# Patient Record
Sex: Female | Born: 1945 | Race: White | Hispanic: No | Smoking: Never smoker
Health system: Southern US, Community
[De-identification: ages and names within clinical notes are randomized; demographics above are authoritative.]

## PROBLEM LIST (undated history)

## (undated) DIAGNOSIS — E119 Type 2 diabetes mellitus without complications: Secondary | ICD-10-CM

## (undated) DIAGNOSIS — I10 Essential (primary) hypertension: Secondary | ICD-10-CM

## (undated) HISTORY — PX: OTHER SURGICAL HISTORY: SHX169

## (undated) HISTORY — PX: KNEE SURGERY: SHX244

## (undated) HISTORY — PX: CORONARY STENT PLACEMENT: SHX1402

## (undated) HISTORY — PX: CHOLECYSTECTOMY: SHX55

---

## 2016-02-24 ENCOUNTER — Encounter (HOSPITAL_COMMUNITY): Payer: Self-pay | Admitting: *Deleted

## 2016-02-24 ENCOUNTER — Emergency Department (HOSPITAL_COMMUNITY): Payer: Medicare Other

## 2016-02-24 ENCOUNTER — Observation Stay (HOSPITAL_COMMUNITY)
Admission: EM | Admit: 2016-02-24 | Discharge: 2016-02-25 | Disposition: A | Discharge status: Home / Self Care | Payer: Medicare Other | Attending: Family Medicine | Admitting: Family Medicine

## 2016-02-24 DIAGNOSIS — N390 Urinary tract infection, site not specified: Secondary | ICD-10-CM | POA: Insufficient documentation

## 2016-02-24 DIAGNOSIS — N179 Acute kidney failure, unspecified: Secondary | ICD-10-CM | POA: Diagnosis not present

## 2016-02-24 DIAGNOSIS — E785 Hyperlipidemia, unspecified: Secondary | ICD-10-CM | POA: Insufficient documentation

## 2016-02-24 DIAGNOSIS — R262 Difficulty in walking, not elsewhere classified: Secondary | ICD-10-CM | POA: Diagnosis not present

## 2016-02-24 DIAGNOSIS — E119 Type 2 diabetes mellitus without complications: Secondary | ICD-10-CM | POA: Insufficient documentation

## 2016-02-24 DIAGNOSIS — D649 Anemia, unspecified: Secondary | ICD-10-CM | POA: Insufficient documentation

## 2016-02-24 DIAGNOSIS — R4189 Other symptoms and signs involving cognitive functions and awareness: Secondary | ICD-10-CM

## 2016-02-24 DIAGNOSIS — Z955 Presence of coronary angioplasty implant and graft: Secondary | ICD-10-CM | POA: Diagnosis not present

## 2016-02-24 DIAGNOSIS — R55 Syncope and collapse: Principal | ICD-10-CM | POA: Insufficient documentation

## 2016-02-24 DIAGNOSIS — R8271 Bacteriuria: Secondary | ICD-10-CM

## 2016-02-24 DIAGNOSIS — N183 Chronic kidney disease, stage 3 unspecified: Secondary | ICD-10-CM

## 2016-02-24 DIAGNOSIS — Z66 Do not resuscitate: Secondary | ICD-10-CM | POA: Diagnosis not present

## 2016-02-24 DIAGNOSIS — I4891 Unspecified atrial fibrillation: Secondary | ICD-10-CM | POA: Insufficient documentation

## 2016-02-24 DIAGNOSIS — Z79899 Other long term (current) drug therapy: Secondary | ICD-10-CM | POA: Diagnosis not present

## 2016-02-24 DIAGNOSIS — I2510 Atherosclerotic heart disease of native coronary artery without angina pectoris: Secondary | ICD-10-CM | POA: Insufficient documentation

## 2016-02-24 DIAGNOSIS — I10 Essential (primary) hypertension: Secondary | ICD-10-CM | POA: Insufficient documentation

## 2016-02-24 DIAGNOSIS — Z794 Long term (current) use of insulin: Secondary | ICD-10-CM | POA: Insufficient documentation

## 2016-02-24 HISTORY — DX: Type 2 diabetes mellitus without complications: E11.9

## 2016-02-24 HISTORY — DX: Essential (primary) hypertension: I10

## 2016-02-24 LAB — CBG MONITORING, ED: Glucose-Capillary: 190 mg/dL — ABNORMAL HIGH (ref 65–99)

## 2016-02-24 LAB — BASIC METABOLIC PANEL
ANION GAP: 8 (ref 5–15)
BUN: 16 mg/dL (ref 6–20)
CO2: 22 mmol/L (ref 22–32)
Calcium: 8.7 mg/dL — ABNORMAL LOW (ref 8.9–10.3)
Chloride: 110 mmol/L (ref 101–111)
Creatinine, Ser: 1.91 mg/dL — ABNORMAL HIGH (ref 0.44–1.00)
GFR calc Af Amer: 29 mL/min — ABNORMAL LOW (ref >60)
GFR, EST NON AFRICAN AMERICAN: 25 mL/min — AB (ref >60)
GLUCOSE: 191 mg/dL — AB (ref 65–99)
POTASSIUM: 4 mmol/L (ref 3.5–5.1)
SODIUM: 140 mmol/L (ref 135–145)

## 2016-02-24 LAB — CBC
HEMATOCRIT: 28.9 % — AB (ref 36.0–46.0)
HEMATOCRIT: 28.3 % — AB (ref 36.0–46.0)
HEMOGLOBIN: 9.6 g/dL — AB (ref 12.0–15.0)
Hemoglobin: 9.1 g/dL — ABNORMAL LOW (ref 12.0–15.0)
MCH: 30.4 pg (ref 26.0–34.0)
MCH: 29.6 pg (ref 26.0–34.0)
MCHC: 33.2 g/dL (ref 30.0–36.0)
MCHC: 32.2 g/dL (ref 30.0–36.0)
MCV: 91.5 fL (ref 78.0–100.0)
MCV: 92.2 fL (ref 78.0–100.0)
Platelets: 284 10*3/uL (ref 150–400)
Platelets: 316 10*3/uL (ref 150–400)
RBC: 3.16 MIL/uL — AB (ref 3.87–5.11)
RBC: 3.07 MIL/uL — ABNORMAL LOW (ref 3.87–5.11)
RDW: 13.5 % (ref 11.5–15.5)
RDW: 13.7 % (ref 11.5–15.5)
WBC: 8.3 10*3/uL (ref 4.0–10.5)
WBC: 9.6 10*3/uL (ref 4.0–10.5)

## 2016-02-24 LAB — URINALYSIS, ROUTINE W REFLEX MICROSCOPIC
Bilirubin Urine: NEGATIVE
Glucose, UA: NEGATIVE mg/dL
Hgb urine dipstick: NEGATIVE
Ketones, ur: NEGATIVE mg/dL
NITRITE: NEGATIVE
PH: 5.5 (ref 5.0–8.0)
Protein, ur: 30 mg/dL — AB
SPECIFIC GRAVITY, URINE: 1.018 (ref 1.005–1.030)

## 2016-02-24 LAB — PROTIME-INR
INR: 1.67 — AB (ref 0.00–1.49)
Prothrombin Time: 19.7 seconds — ABNORMAL HIGH (ref 11.6–15.2)

## 2016-02-24 LAB — I-STAT CHEM 8, ED
BUN: 20 mg/dL (ref 6–20)
Calcium, Ion: 1.06 mmol/L — ABNORMAL LOW (ref 1.12–1.23)
Chloride: 109 mmol/L (ref 101–111)
Creatinine, Ser: 1.9 mg/dL — ABNORMAL HIGH (ref 0.44–1.00)
Glucose, Bld: 192 mg/dL — ABNORMAL HIGH (ref 65–99)
HEMATOCRIT: 29 % — AB (ref 36.0–46.0)
HEMOGLOBIN: 9.9 g/dL — AB (ref 12.0–15.0)
POTASSIUM: 5.1 mmol/L (ref 3.5–5.1)
Sodium: 141 mmol/L (ref 135–145)
TCO2: 20 mmol/L (ref 0–100)

## 2016-02-24 LAB — GLUCOSE, CAPILLARY
GLUCOSE-CAPILLARY: 141 mg/dL — AB (ref 65–99)
Glucose-Capillary: 188 mg/dL — ABNORMAL HIGH (ref 65–99)

## 2016-02-24 LAB — CREATININE, SERUM
Creatinine, Ser: 1.93 mg/dL — ABNORMAL HIGH (ref 0.44–1.00)
GFR calc Af Amer: 29 mL/min — ABNORMAL LOW (ref >60)
GFR calc non Af Amer: 25 mL/min — ABNORMAL LOW (ref >60)

## 2016-02-24 LAB — URINE MICROSCOPIC-ADD ON

## 2016-02-24 LAB — TROPONIN I: Troponin I: <0.03 ng/mL (ref <0.03)

## 2016-02-24 LAB — POC OCCULT BLOOD, ED: Fecal Occult Bld: NEGATIVE

## 2016-02-24 LAB — TSH: TSH: 1.434 u[IU]/mL (ref 0.350–4.500)

## 2016-02-24 MED ORDER — PRAVASTATIN SODIUM 40 MG PO TABS
20.0000 mg | ORAL_TABLET | Freq: Every day | ORAL | Status: DC
  Administered 2016-02-24 – 2016-02-25 (×2): 20 mg via ORAL
  Filled 2016-02-24 (×2): qty 1

## 2016-02-24 MED ORDER — SERTRALINE HCL 50 MG PO TABS: 50.0000 mg | ORAL_TABLET | Freq: Every day | ORAL | Status: DC

## 2016-02-24 MED ORDER — ENOXAPARIN SODIUM 30 MG/0.3ML ~~LOC~~ SOLN
30.0000 mg | SUBCUTANEOUS | Status: DC
  Administered 2016-02-24: 30 mg via SUBCUTANEOUS
  Filled 2016-02-24: qty 0.3

## 2016-02-24 MED ORDER — SODIUM CHLORIDE 0.9% FLUSH
3.0000 mL | INTRAVENOUS | Status: DC | PRN
  Administered 2016-02-25: 3 mL via INTRAVENOUS
  Filled 2016-02-24: qty 3

## 2016-02-24 MED ORDER — SODIUM CHLORIDE 0.9% FLUSH
3.0000 mL | Freq: Two times a day (BID) | INTRAVENOUS | Status: DC
  Administered 2016-02-24 – 2016-02-25 (×2): 3 mL via INTRAVENOUS

## 2016-02-24 MED ORDER — INSULIN ASPART 100 UNIT/ML ~~LOC~~ SOLN
0.0000 [IU] | Freq: Three times a day (TID) | SUBCUTANEOUS | Status: DC
  Administered 2016-02-24: 3 [IU] via SUBCUTANEOUS
  Administered 2016-02-25: 2 [IU] via SUBCUTANEOUS
  Administered 2016-02-25: 5 [IU] via SUBCUTANEOUS

## 2016-02-24 MED ORDER — SODIUM CHLORIDE 0.9 % IV SOLN: 250.0000 mL | INTRAVENOUS | Status: DC | PRN

## 2016-02-24 MED ORDER — PROMETHAZINE HCL 25 MG/ML IJ SOLN
12.5000 mg | Freq: Once | INTRAMUSCULAR | Status: AC
  Administered 2016-02-24: 12.5 mg via INTRAVENOUS
  Filled 2016-02-24: qty 1

## 2016-02-24 MED ORDER — GABAPENTIN 300 MG PO CAPS: 300.0000 mg | ORAL_CAPSULE | Freq: Three times a day (TID) | ORAL | Status: DC

## 2016-02-24 MED ORDER — SERTRALINE HCL 50 MG PO TABS
50.0000 mg | ORAL_TABLET | Freq: Every day | ORAL | Status: DC
  Administered 2016-02-24: 50 mg via ORAL
  Filled 2016-02-24: qty 1

## 2016-02-24 MED ORDER — INSULIN ASPART 100 UNIT/ML ~~LOC~~ SOLN: 0.0000 [IU] | Freq: Every day | SUBCUTANEOUS | Status: DC

## 2016-02-24 MED ORDER — SODIUM CHLORIDE 0.9 % IV SOLN: INTRAVENOUS | Status: DC

## 2016-02-24 MED ORDER — ACETAMINOPHEN 325 MG PO TABS
650.0000 mg | ORAL_TABLET | Freq: Four times a day (QID) | ORAL | Status: DC | PRN
  Administered 2016-02-24 – 2016-02-25 (×2): 650 mg via ORAL
  Filled 2016-02-24 (×2): qty 2

## 2016-02-24 MED ORDER — LORAZEPAM 2 MG/ML IJ SOLN: 1.0000 mg | Freq: Once | INTRAMUSCULAR | Status: DC

## 2016-02-24 MED ORDER — ONDANSETRON HCL 4 MG/2ML IJ SOLN
4.0000 mg | Freq: Once | INTRAMUSCULAR | Status: AC
  Administered 2016-02-24: 4 mg via INTRAVENOUS
  Filled 2016-02-24: qty 2

## 2016-02-24 MED ORDER — DEXTROSE 5 % IV SOLN
1.0000 g | Freq: Once | INTRAVENOUS | Status: AC
  Administered 2016-02-24: 1 g via INTRAVENOUS
  Filled 2016-02-24: qty 10

## 2016-02-24 MED ORDER — SODIUM CHLORIDE 0.9% FLUSH: 3.0000 mL | Freq: Two times a day (BID) | INTRAVENOUS | Status: DC

## 2016-02-24 MED ORDER — INSULIN GLARGINE 100 UNIT/ML ~~LOC~~ SOLN
30.0000 [IU] | Freq: Every day | SUBCUTANEOUS | Status: DC
  Administered 2016-02-24: 30 [IU] via SUBCUTANEOUS
  Filled 2016-02-24 (×2): qty 0.3

## 2016-02-24 MED ORDER — SODIUM CHLORIDE 0.45 % IV SOLN
INTRAVENOUS | Status: AC
  Administered 2016-02-24: 19:00:00 via INTRAVENOUS

## 2016-02-24 MED ORDER — GABAPENTIN 300 MG PO CAPS
300.0000 mg | ORAL_CAPSULE | Freq: Three times a day (TID) | ORAL | Status: DC
  Administered 2016-02-24 – 2016-02-25 (×4): 300 mg via ORAL
  Filled 2016-02-24 (×4): qty 1

## 2016-02-24 NOTE — ED Provider Notes (Addendum)
CSN: [id number]     Arrival date & time 02/24/16  1021 History   First MD Initiated Contact with Patient 02/24/16 1032     Chief Complaint  Patient presents with  . Loss of Consciousness  . Emesis  . Atrial Fibrillation     (Consider location/radiation/quality/duration/timing/severity/associated sxs/prior Treatment) Patient is a 70 y.o. female presenting with syncope, vomiting, and atrial fibrillation. The history is provided by the patient.  Loss of Consciousness Associated symptoms: headaches and vomiting   Associated symptoms: no chest pain, no fever, no palpitations, no shortness of breath and no weakness   Emesis Associated symptoms: headaches   Associated symptoms: no abdominal pain, no chills and no sore throat   Atrial Fibrillation Associated symptoms include headaches. Pertinent negatives include no chest pain, no abdominal pain and no shortness of breath.  Patient from home, with syncopal episode, ?loc few minutes, then with nausea and vomiting. No diarrhea. Patient with gradual onset of a dull, mild, headache, mild, diffuse. No neck pain or stiffness.  Also report of recent high and low blood sugars, cbg currently 190.  No chest pain or discomfort. No sob. No palpitation. C/o feeling nauseated. (above history per student, on my entering room, patient unresponsive verbally).  Family present subsequently, additional hx - was talking on phone, in bed, and then became unresponsive - did not lose postural tone. No seizure activity. No postictal period. No increased wob or resp distress noted. On arrival to ED at baseline, talking, but then another unresponsive period - during this period, maintained postural tone, one hand holding emesis bag other holding handrail, no change in monitor re heart rate or rhythm, or pulse ox reading. A few minutes later on transport to xr patient again talking normally, at baseline. Family states patient just visiting from [place].        Past  Medical History  Diagnosis Date  . Diabetes mellitus without complication (HCC)   . Hypertension    Past Surgical History  Procedure Laterality Date  . Coronary stent placement    . Knee surgery Right   . Arm surgery Right   . Cholecystectomy     No family history on file. Social History  Substance Use Topics  . Smoking status: Never Smoker   . Smokeless tobacco: None  . Alcohol Use: No   OB History    No data available     Review of Systems  Constitutional: Negative for fever and chills.  HENT: Negative for sore throat.   Eyes: Negative for visual disturbance.  Respiratory: Negative for shortness of breath.   Cardiovascular: Positive for syncope. Negative for chest pain and palpitations.  Gastrointestinal: Positive for vomiting. Negative for abdominal pain.  Genitourinary: Negative for dysuria and flank pain.  Musculoskeletal: Negative for back pain and neck pain.  Skin: Negative for wound.  Neurological: Positive for headaches. Negative for weakness and numbness.  Hematological: Does not bruise/bleed easily.  Psychiatric/Behavioral: Negative for agitation.      Allergies  Review of patient's allergies indicates no known allergies.  Home Medications   Prior to Admission medications   Not on File   BP 184/96 mmHg  Pulse 110  Temp(Src) 98 F (36.7 C) (Oral)  Resp 19  Ht 5' (1.524 m)  Wt 81.647 kg  BMI 35.15 kg/m2  SpO2 100% Physical Exam  Constitutional: She appears well-developed and well-nourished. No distress.  HENT:  Mouth/Throat: Oropharynx is clear and moist.  Eyes: Pupils are equal, round, and reactive to light.  Eyes deviated to right.   Neck: Normal range of motion. Neck supple. No tracheal deviation present.  No bruits  Cardiovascular: Normal rate and intact distal pulses.   No murmur heard. Pulmonary/Chest: Effort normal and breath sounds normal. No respiratory distress.  Abdominal: Soft. Normal appearance and bowel sounds are normal. She  exhibits no distension. There is no tenderness.  Genitourinary:  No cva tenderness  Musculoskeletal: She exhibits no edema.  CTLS spine, non tender, aligned, no step off. No focal bony tenderness on bil ext exam.   Neurological:  Patient unresponsive verbally (reported as change from minutes prior). Holding onto right arm rail, but not responsive/following commands.   Skin: Skin is warm and dry. No rash noted. She is not diaphoretic.  Psychiatric:  Eyes closed. Unresponsive.   Nursing note and vitals reviewed.   ED Course  Procedures (including critical care time) Labs Review   Results for orders placed or performed during the hospital encounter of 02/24/16  Basic metabolic panel  Result Value Ref Range   Sodium 140 135 - 145 mmol/L   Potassium 4.0 3.5 - 5.1 mmol/L   Chloride 110 101 - 111 mmol/L   CO2 22 22 - 32 mmol/L   Glucose, Bld 191 (H) 65 - 99 mg/dL   BUN 16 6 - 20 mg/dL   Creatinine, Ser 4.091.91 (H) 0.44 - 1.00 mg/dL   Calcium 8.7 (L) 8.9 - 10.3 mg/dL   GFR calc non Af Amer 25 (L) >60 mL/min   GFR calc Af Amer 29 (L) >60 mL/min   Anion gap 8 5 - 15  CBC  Result Value Ref Range   WBC 8.3 4.0 - 10.5 K/uL   RBC 3.16 (L) 3.87 - 5.11 MIL/uL   Hemoglobin 9.6 (L) 12.0 - 15.0 g/dL   HCT 81.128.9 (L) 91.436.0 - 78.246.0 %   MCV 91.5 78.0 - 100.0 fL   MCH 30.4 26.0 - 34.0 pg   MCHC 33.2 30.0 - 36.0 g/dL   RDW 95.613.5 21.311.5 - 08.615.5 %   Platelets 284 150 - 400 K/uL  Urinalysis, Routine w reflex microscopic  Result Value Ref Range   Color, Urine YELLOW YELLOW   APPearance CLOUDY (A) CLEAR   Specific Gravity, Urine 1.018 1.005 - 1.030   pH 5.5 5.0 - 8.0   Glucose, UA NEGATIVE NEGATIVE mg/dL   Hgb urine dipstick NEGATIVE NEGATIVE   Bilirubin Urine NEGATIVE NEGATIVE   Ketones, ur NEGATIVE NEGATIVE mg/dL   Protein, ur 30 (A) NEGATIVE mg/dL   Nitrite NEGATIVE NEGATIVE   Leukocytes, UA SMALL (A) NEGATIVE  Troponin I  Result Value Ref Range   Troponin I <0.03 <0.03 ng/mL  Protime-INR   Result Value Ref Range   Prothrombin Time 19.7 (H) 11.6 - 15.2 seconds   INR 1.67 (H) 0.00 - 1.49  Urine microscopic-add on  Result Value Ref Range   Squamous Epithelial / LPF TOO NUMEROUS TO COUNT (A) NONE SEEN   WBC, UA 6-30 0 - 5 WBC/hpf   RBC / HPF 0-5 0 - 5 RBC/hpf   Bacteria, UA MANY (A) NONE SEEN   Casts HYALINE CASTS (A) NEGATIVE  CBG monitoring, ED  Result Value Ref Range   Glucose-Capillary 190 (H) 65 - 99 mg/dL   Comment 1 Notify RN    Comment 2 Document in Chart   I-stat chem 8, ed  Result Value Ref Range   Sodium 141 135 - 145 mmol/L   Potassium 5.1 3.5 - 5.1 mmol/L  Chloride 109 101 - 111 mmol/L   BUN 20 6 - 20 mg/dL   Creatinine, Ser 1.61 (H) 0.44 - 1.00 mg/dL   Glucose, Bld 096 (H) 65 - 99 mg/dL   Calcium, Ion 0.45 (L) 1.12 - 1.23 mmol/L   TCO2 20 0 - 100 mmol/L   Hemoglobin 9.9 (L) 12.0 - 15.0 g/dL   HCT 40.9 (L) 81.1 - 91.4 %   Ct Head Wo Contrast  02/24/2016  CLINICAL DATA:  Unwitnessed fall with period of unresponsiveness EXAM: CT HEAD WITHOUT CONTRAST TECHNIQUE: Contiguous axial images were obtained from the base of the skull through the vertex without intravenous contrast. COMPARISON:  None. FINDINGS: Brain: There is mild generalized atrophy. The ventricles in proportion are more prominent than the sulci. There is no intracranial mass, hemorrhage, extra-axial collection, or midline shift. There is slight small vessel disease in the centra semiovale bilaterally. Gray-white compartments elsewhere are normal. No acute infarct evident. Vascular: There is a small amount of calcification in each distal vertebral artery. There are foci of calcification in each cavernous carotid artery. No hyperdense vessels evident. Skull: Bony calvarium appears intact. Sinuses/Orbits: There is mucosal thickening in both maxillary antra. There is mild mucosal thickening in multiple ethmoid air cells bilaterally. Other visualized paranasal sinuses are clear. Orbits appear symmetric and  unremarkable bilaterally. 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 IMPRESSION: Mild generalized atrophy. Question a degree of superimposed normal pressure hydrocephalus. There is no intracranial mass or hemorrhage. There is slight periventricular small vessel disease. No acute infarct. There are areas of paranasal sinus disease. Electronically Signed   By: Bretta Bang III M.D.   On: 02/24/2016 11:30   Dg Chest Port 1 View  02/24/2016  CLINICAL DATA:  Syncopal episode.  Nausea and vomiting. EXAM: PORTABLE CHEST 1 VIEW COMPARISON:  None. FINDINGS: Normal sized heart. Clear lungs. Small calcified granulomas in the right upper lobe. Thoracic spine degenerative changes. IMPRESSION: No acute abnormality. Electronically Signed   By: Beckie Salts M.D.   On: 02/24/2016 11:23       I have personally reviewed and evaluated these images and lab results as part of my medical decision-making.   EKG Interpretation   Date/Time:  Tuesday February 24 2016 10:38:31 EDT Ventricular Rate:  106 PR Interval:    QRS Duration: 134 QT Interval:  352 QTC Calculation: 468 R Axis:   96 Text Interpretation:  Sinus tachycardia Nonspecific intraventricular  conduction delay Nonspecific T wave abnormality No previous tracing  Confirmed by Denton Lank  MD, Caryn Bee ([id number]) on 02/24/2016 11:07:25 AM Also  confirmed by Denton Lank  MD, Dinara Lupu ([id number]), editor [place]  CCT, BEVERLY  ([id number])  on 02/24/2016 11:12:35 AM      MDM   Iv ns. Continuous pulse ox and monitor. o2 [place]. Labs. Stat ct.  Reviewed nursing notes and prior charts for additional history.   Po fluids. Ambulate.  Recheck, patient awake and alert. No increased wob. No distress.  ?uti on labs, given nv/symptoms, will culture and rx. Rocephin iv.  Sent for old records from [facility] in [place] - records faxed not particularly helpful, no labs or ecg obtained.  Given unresponsive episode, syncope vs more functional etiology, renal insuff  and minor ecg changes without old ecg to compare, possible uti, will admit.  Discussed patient with Family Practice Resident - they will see/admit.      Cathren Laine, MD 02/24/16 [id number]

## 2016-02-24 NOTE — ED Notes (Signed)
Daughter at bedside.

## 2016-02-24 NOTE — ED Notes (Signed)
Pt in from home c/o witnessed syncopal episode by family lasting 5 mins with n/v onset today, pt denies diarrhea, pt in a fib pta with hx of the same, pt in intermittent A fib & ST in route to ED, pt c/o HA upon arrival to ED, pt rcvd 8 mg Zofran pta, pt reports intermittent episodes of fluctuating blood sugar, pt A&O x4, denies SOB & CP, pt denies diarrhea

## 2016-02-24 NOTE — ED Notes (Signed)
Pt denies nausea at this time.

## 2016-02-24 NOTE — Progress Notes (Addendum)
Pt oriented to room and equipment. Pt denies pain. Expresses some feelings of nausea. Paged MD and received one time order for Zofran.  Pt CBG 188. Paged MD and obtained order for accuchecks and insulin coverage.  Pt episode of vomiting in ED and placed on enteric precautions. Per Infection Prevention unless MD felt need for GI panel enteric precautions could be discontinued. MD did not feel need and given verbal order to discontinue enteric precautions.    Leonidas Rombergaitlin S Bumbledare, RN

## 2016-02-24 NOTE — ED Notes (Signed)
Pt ambulated in hall; tolerated well 

## 2016-02-24 NOTE — ED Notes (Signed)
CBG 190

## 2016-02-24 NOTE — ED Notes (Signed)
Admitting at bedside 

## 2016-02-24 NOTE — ED Notes (Signed)
Pt requested to sit in a chair and have nasal cannula removed; this tech placed pt in chair and removed cannula (with both actions pre-approved by Joni ReiningNicole, Charity fundraiserN); Pt O2 saturation fluctuating between 99% and 100% on room air

## 2016-02-24 NOTE — ED Notes (Signed)
Cheree DittoGraham crackers and saltines provided to pt per Toniann FailWendy, Barrister's clerkN and Cricket, RCharity fundraiser

## 2016-02-24 NOTE — ED Notes (Signed)
Per Denton LankSteinl, MD pt not responding to verbal stimuli, pt airway WNL, this RN to accompany pt to CT emergently to r/o head bleed, CT aware of plan

## 2016-02-24 NOTE — H&P (Signed)
Family Medicine Teaching [facility] Hospital Admission History and Physical Service Pager: [phone]  Patient name: Carolyn SenateDoris Lape Medical record number: [id number] Date of birth: August 15, 1946 Age: 70 y.o. Gender: female  Primary Care Provider: No primary care provider on file. Consultants: None Code Status: DNR - Discussed with patient and family on admission  Chief Complaint: Syncope  Assessment and Plan: Carolyn SenateDoris Stogdill is a 70 y.o. female presenting with Syncope . PMH is significant for HTN, HLD, T2DM, afib.  Syncope. Concern for cardiac etiology given history of CAD and abrupt onset. Consider vasovagal syncope given preceding feeling of warmness and diaphoresis, though somewhat of an atypical presentation. Doubt orthostasis as patient's symptoms occurred while lying flat - though possible. CBG reportedly 121 at the scene - doubt hypoglycemia as the cause, though patient has had labile CBGS lately per family. Initial troponin and EKG negative. No signs of seizure activity. - Monitor on telemetry overnight. - Check Echo - Check orthostatic vitals - If above unremarkable, will likely need 30 day event monitor.  - PT/OT consult  Elevated Creatinine. Cr 1.9 on admission. Baseline not known. ON lisinopril at home. Appears euvolemic on exam.  - Maintenance IVF overnight - Hold home ACE inhibitor - Try to obtain PCP records - Trend BMP  Normocytic Anemia. Hgb 9.6 on admission. Baseline not known. FOBT negative.  - Obtain records - Trend CBC  Pyuria. UA in ED with small leukocytes, many bacteria, many squamous cells. Received one dose of CTX in ED. Asymptomatic.  - Would not treat as patient does not meed McGeer Criteria.   ?NPH - Head CT with questionable superimposed normal pressure hydrocephalus. Does not have urinary incontinence or any obvious mental status alterations.  - PT consulted as above  ?history of atrial fibrillation. Patient reports history of atrial fibrillation.  Sinus  rhythm on admission. Chads2vasc at least 4. INR 1.67, patient is unsure about anticoagulation. - Obtain outside records re: anticoagulation - If not on anticoagulation at home, should be at least on aspirin.   HLD/CAD/HTN. Stent placed in 2014. Does not appear to be on a statin or ASA. - Obtain records, start statin and ASA 81mg  if not on already - Hold home ACE inhibitor for AKI  T2DM. - Hold home glipizide - SSI  FEN/GI: 1/2 NS @ 75cc/hr for 12hrs Prophylaxis: Lovenox while INR is subtherapeutic  Disposition: Admitted for observation pending above work up  History of Present Illness:  Carolyn [name] is a 70 y.o. female presenting with syncope. History is provided by the patient and her daughters.  Patient had an unconscious episode this morning after going to the bathroom. Says that after going to the bathroom, she laid on her bed and the next thing she remembers is being evaluated by EMS. Per the patient's daughter, patient was unresponsive for 10-15 minutes. No seizure like activity during this time. No urinary incontinince. Patient felt a little hot with some sweats before the episode with mild nausea. No palpitations or chest pain. No shortness of breath. No other fevers or chills.  EMS arrived to the scene and checked a CBG which was 121. En route to the hospital, patient had another episode of unresponsiveness, though per report was able to maintain body tone.   Patient also reports a similar episode about 6 months ago. Says that her PCP gave her a medication to keep from having these episodes.   Patient currently feels fine and wants to go home. No recent illnesses. No fevers or chills. No dysuria, frequency,  or urgency. No abdominal pain.   Review Of Systems: Per HPI, otherwise the remainder of the systems were negative.  Past Medical History: Past Medical History  Diagnosis Date  . Diabetes mellitus without complication (HCC)   . Hypertension     Past Surgical  History: Past Surgical History  Procedure Laterality Date  . Coronary stent placement    . Knee surgery Right   . Arm surgery Right   . Cholecystectomy      Social History: Social History  Substance Use Topics  . Smoking status: Never Smoker   . Smokeless tobacco: None  . Alcohol Use: No    Please also refer to relevant sections of EMR.  Family History: Non contributory.   Allergies and Medications: No Known Allergies No current facility-administered medications on file prior to encounter.   No current outpatient prescriptions on file prior to encounter.    Objective: BP 123/68 mmHg  Pulse 100  Temp(Src) 98 F (36.7 C) (Oral)  Resp 15  Ht 5' (1.524 m)  Wt 180 lb (81.647 kg)  BMI 35.15 kg/m2  SpO2 100% Exam: General: 70 year old female in NAD, sitting in bedside chair Eyes: EOMI ENTM: MMM, O/P clear Neck: Supple, no bruits Cardiovascular: Tachycardic, No murmurs noted Respiratory: NWOB, CTAB Abdomen: S, NT, ND MSK: No cyanosis or edema Skin: Warm, dry Neuro: Alert and conversational. CN2-12 intact. Strength 5/5 in upper and lower extremities. Gait deferred.  Psych: Appropriate mood and affect.   Labs and Imaging: CBC BMET   Recent Labs Lab 02/24/16 1220 02/24/16 1223  WBC 8.3  --   HGB 9.6* 9.9*  HCT 28.9* 29.0*  PLT 284  --     Recent Labs Lab 02/24/16 1220 02/24/16 1223  NA 140 141  K 4.0 5.1  CL 110 109  CO2 22  --   BUN 16 20  CREATININE 1.91* 1.90*  GLUCOSE 191* 192*  CALCIUM 8.7*  --      INR 1.67 Trop <0.03  UA: Many bacteria, pos leuks, TNTC squams  EKG: Sinus tachycardia, no acute ischemic changes  Ct Head Wo Contrast  02/24/2016  CLINICAL DATA:  Unwitnessed fall with period of unresponsiveness EXAM: CT HEAD WITHOUT CONTRAST TECHNIQUE: Contiguous axial images were obtained from the base of the skull through the vertex without intravenous contrast. COMPARISON:  None. FINDINGS: Brain: There is mild generalized atrophy. The  ventricles in proportion are more prominent than the sulci. There is no intracranial mass, hemorrhage, extra-axial collection, or midline shift. There is slight small vessel disease in the centra semiovale bilaterally. Gray-white compartments elsewhere are normal. No acute infarct evident. Vascular: There is a small amount of calcification in each distal vertebral artery. There are foci of calcification in each cavernous carotid artery. No hyperdense vessels evident. Skull: Bony calvarium appears intact. Sinuses/Orbits: There is mucosal thickening in both maxillary antra. There is mild mucosal thickening in multiple ethmoid air cells bilaterally. Other visualized paranasal sinuses are clear. Orbits appear symmetric and unremarkable bilaterally. 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 IMPRESSION: Mild generalized atrophy. Question a degree of superimposed normal pressure hydrocephalus. There is no intracranial mass or hemorrhage. There is slight periventricular small vessel disease. No acute infarct. There are areas of paranasal sinus disease. Electronically Signed   By: Bretta Bang III M.D.   On: 02/24/2016 11:30   Dg Chest Port 1 View  02/24/2016  CLINICAL DATA:  Syncopal episode.  Nausea and vomiting. EXAM: PORTABLE CHEST 1 VIEW COMPARISON:  None. FINDINGS: Normal sized heart. Clear lungs. Small calcified granulomas in the right upper lobe. Thoracic spine degenerative changes. IMPRESSION: No acute abnormality. Electronically Signed   By: Beckie Salts M.D.   On: 02/24/2016 11:23   Ardith Dark, MD 02/24/2016, 3:07 PM PGY-3, [place] [facility] FPTS Intern pager: [phone], text pages welcome

## 2016-02-25 ENCOUNTER — Observation Stay (HOSPITAL_BASED_OUTPATIENT_CLINIC_OR_DEPARTMENT_OTHER): Payer: Medicare Other

## 2016-02-25 DIAGNOSIS — R55 Syncope and collapse: Secondary | ICD-10-CM | POA: Diagnosis not present

## 2016-02-25 LAB — GLUCOSE, CAPILLARY
GLUCOSE-CAPILLARY: 62 mg/dL — AB (ref 65–99)
GLUCOSE-CAPILLARY: 134 mg/dL — AB (ref 65–99)
Glucose-Capillary: 57 mg/dL — ABNORMAL LOW (ref 65–99)
Glucose-Capillary: 136 mg/dL — ABNORMAL HIGH (ref 65–99)
Glucose-Capillary: 209 mg/dL — ABNORMAL HIGH (ref 65–99)

## 2016-02-25 LAB — BASIC METABOLIC PANEL
ANION GAP: 8 (ref 5–15)
BUN: 20 mg/dL (ref 6–20)
CHLORIDE: 110 mmol/L (ref 101–111)
CO2: 22 mmol/L (ref 22–32)
Calcium: 8.5 mg/dL — ABNORMAL LOW (ref 8.9–10.3)
Creatinine, Ser: 1.97 mg/dL — ABNORMAL HIGH (ref 0.44–1.00)
GFR calc Af Amer: 28 mL/min — ABNORMAL LOW (ref >60)
GFR, EST NON AFRICAN AMERICAN: 24 mL/min — AB (ref >60)
GLUCOSE: 62 mg/dL — AB (ref 65–99)
POTASSIUM: 4 mmol/L (ref 3.5–5.1)
Sodium: 140 mmol/L (ref 135–145)

## 2016-02-25 LAB — CBC
HEMATOCRIT: 24.7 % — AB (ref 36.0–46.0)
HEMOGLOBIN: 8.2 g/dL — AB (ref 12.0–15.0)
MCH: 30.7 pg (ref 26.0–34.0)
MCHC: 33.2 g/dL (ref 30.0–36.0)
MCV: 92.5 fL (ref 78.0–100.0)
Platelets: 287 10*3/uL (ref 150–400)
RBC: 2.67 MIL/uL — ABNORMAL LOW (ref 3.87–5.11)
RDW: 13.8 % (ref 11.5–15.5)
WBC: 9.2 10*3/uL (ref 4.0–10.5)

## 2016-02-25 LAB — URINE CULTURE

## 2016-02-25 LAB — ECHOCARDIOGRAM COMPLETE
HEIGHTINCHES: 60 in
Weight: 2880 oz

## 2016-02-25 MED ORDER — INSULIN GLARGINE 100 UNIT/ML ~~LOC~~ SOLN
25.0000 [IU] | Freq: Every day | SUBCUTANEOUS | Status: DC
  Filled 2016-02-25: qty 0.25

## 2016-02-25 NOTE — Progress Notes (Signed)
Inpatient Diabetes Program Recommendations  AACE/ADA: New Consensus Statement on Inpatient Glycemic Control (2015)  Target Ranges:  Prepandial:   less than 140 mg/dL      Peak postprandial:   less than 180 mg/dL (1-2 hours)      Critically ill patients:  140 - 180 mg/dL    Review of Glycemic ControlResults for Salem SenateLDRIDGE, Dustee (MRN [id number]) as of 02/25/2016 13:37  Ref. Range 02/25/2016 06:24 02/25/2016 06:59 02/25/2016 07:49 02/25/2016 11:10  Glucose-Capillary Latest Ref Range: 65-99 mg/dL 62 (L) 57 (L) 782136 (H) 956209 (H)   Diabetes history: Type 2 diabetes Outpatient Diabetes medications: Lantus 60 units q HS, Glipizide 10 mg with breakfast Current orders for Inpatient glycemic control:  Lantus 30 units q HS, Novolog moderate tid with meals and HS  Inpatient Diabetes Program Recommendations:   Note low fasting blood sugar this morning.  Please reduce Lantus to 25 units q HS. Called and discussed with Dr. Myrtie SomanWarden and orders received.  Thanks, Beryl MeagerJenny Merl Guardino, RN, BC-ADM Inpatient Diabetes Coordinator Pager [phone] (8a-5p)

## 2016-02-25 NOTE — Discharge Summary (Signed)
Family Medicine Teaching [facility] Discharge Summary  Patient name: Carolyn [name] Medical record number: [id number] Date of birth: 10/24/1945 Age: 70 y.o. Gender: female Date of Admission: 02/24/2016  Date of Discharge: 02/24/16 Admitting Physician: Leighton Roach McDiarmid, MD  Primary Care Provider: No primary care provider on file. Consultants: Cardiology   Indication for Hospitalization: Syncopal episodes of unknown etiology   Discharge Diagnoses/Problem List:  Vasovagal Syncope  Disposition: Home  Discharge Condition: Stable  Discharge Exam:   General: 70 year old female in NAD, sitting in bedside chair Eyes: EOMI ENTM: MMM, O/P clear Neck: Supple, no bruits Cardiovascular: Tachycardic, No murmurs noted Respiratory: NWOB, CTAB Abdomen: S, NT, ND MSK: No cyanosis or edema Skin: Warm, dry Neuro: Alert and conversational. CN2-12 intact. Strength 5/5 in upper and lower extremities. Gait deferred.  Psych: Appropriate mood and affect.   Brief Hospital Course:  Carolyn [name] is a 70 y.o. female presenting with Syncope . PMH is significant for HTN, HLD, T2DM, afib  #Syncope. Concern for cardiac etiology given history of CAD and abrupt onset. Consider vasovagal syncope given preceding feeling of warmness and diaphoresis, though somewhat of an atypical presentation. Echocardiogram showed grade 2 diastolic dysfunction. Patient with an unclear history of atrial fibrillation. -- Cardiology follow up for holter monitor placement to rule out arrhythmias.  #Elevated Creatinine. Cr 1.9 on admission, on discharge day 1.97. Appears euvolemic on exam. No baseline to compared to. Should monitor with PCP. -- Maintenance IVF overnight -- Restart ACE inhibitor  #Normocytic Anemia. Hgb 9.6 on admission. Hemoglobin 8.2 on discharge day. Most likely dilutional. -- Follow up with PCP   #Pyuria. UA in ED with small leukocytes, many bacteria, many squamous cells. Received one dose of CTX in  ED. Asymptomatic.  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 Does not have urinary incontinence or any obvious mental status alterations.  - Patient passed Physical therapy assessment   #Possible history of atrial fibrillation. Patient reports history of atrial fibrillation. Sinus rhythm on admission. Chads2vasc at least 4. INR 1.67, patient is unsure about anticoagulation. --Will follow up outpatient with cardiology for Holter monitor placement per cards recs.   HLD/CAD/HTN. Stent placed in 2014. Does not appear to be on a statin or ASA. - Obtain records, start statin and ASA  if not on already - Restart home ACE inhibitor for AKI  T2DM. Well controlled with home regime. -- Restart home glipizide  -- Restarted gabapentin  Issues for Follow Up:  1. Holter Monitor with Cardiology  Significant Procedures: Echocardiogram   Significant Labs and Imaging:   Recent Labs Lab 02/24/16 1220 02/24/16 1223 02/24/16 1927 02/25/16 0407  WBC 8.3  --  9.6 9.2  HGB 9.6* 9.9* 9.1* 8.2*  HCT 28.9* 29.0* 28.3* 24.7*  PLT 284  --  316 287    Recent Labs Lab 02/24/16 1220 02/24/16 1223 02/24/16 1927 02/25/16 0407  NA 140 141  --  140  K 4.0 5.1  --  4.0  CL 110 109  --  110  CO2 22  --   --  22  GLUCOSE 191* 192*  --  62*  BUN 16 20  --  20  CREATININE 1.91* 1.90* 1.93* 1.97*  CALCIUM 8.7*  --   --  8.5*   Imaging studies  Echocardiogram  Left ventricle: The cavity size was normal. There was mild focal  basal hypertrophy of the septum. Systolic function was  moderately  reduced. The estimated ejection fraction was in the range of 35%  to 40%. Diffuse hypokinesis. Features are consistent with a  pseudonormal left ventricular filling pattern, with concomitant  abnormal relaxation and increased filling pressure (grade 2  diastolic dysfunction). Doppler parameters are consistent  with  elevated ventricular end-diastolic filling pressure. - Aortic valve: Trileaflet; normal thickness leaflets. There was no  regurgitation. - Mitral valve: Calcified annulus. Transvalvular velocity was  within the normal range. There was no evidence for stenosis.  There was mild to moderate regurgitation. - Left atrium: The atrium was mildly dilated. - Right ventricle: The cavity size was normal. Wall thickness was  normal. Systolic function was normal. - Right atrium: The atrium was normal in size. - Tricuspid valve: There was mild regurgitation. - Pulmonary arteries: Systolic pressure was moderately increased.  PA peak pressure: 46 mm Hg (S). - Inferior vena cava: The vessel was normal in size. - Pericardium, extracardiac: There was no pericardial effusion.   Results/Tests Pending at Time of Discharge: none  Discharge Medications:    Medication List    TAKE these medications        gabapentin 300 MG capsule  Commonly known as:  NEURONTIN  Take 300 mg by mouth 3 (three) times daily.     glipiZIDE 10 MG tablet  Commonly known as:  GLUCOTROL  Take 10 mg by mouth daily before breakfast.     insulin glargine 100 UNIT/ML injection  Commonly known as:  LANTUS  Inject 60 Units into the skin at bedtime.     lisinopril 10 MG tablet  Commonly known as:  PRINIVIL,ZESTRIL  Take 10 mg by mouth daily.     omeprazole 20 MG capsule  Commonly known as:  PRILOSEC  Take 20 mg by mouth 2 (two) times daily before a meal.     pravastatin 20 MG tablet  Commonly known as:  PRAVACHOL  Take 20 mg by mouth daily.     sertraline 50 MG tablet  Commonly known as:  ZOLOFT  Take 50 mg by mouth at bedtime.        Discharge Instructions: Please refer to Patient Instructions section of EMR for full details.  Patient was counseled important signs and symptoms that should prompt return to medical care, changes in medications, dietary instructions, activity restrictions, and follow up  appointments.   Follow-Up Appointments:   Lovena NeighboursAbdoulaye Juliona Vales, MD 02/25/2016, 5:43 PM PGY-1, [facility]

## 2016-02-25 NOTE — Evaluation (Signed)
Physical Therapy Evaluation Patient Details Name: Carolyn [name] MRN: [id number] DOB: 30-Jun-1946 Today's Date: 02/25/2016   History of Present Illness  70 y.o. female admitted for syncope, a-fib, and nausea and vomitting. PMH significant for HTN and diabetes type II.  Clinical Impression  Pt admitted with above diagnosis. Pt currently with functional limitations due to the deficits listed below (see PT Problem List). Carolyn [name] present with mild balance impairments while ambulating but currently as supervision level for safety.  HR up to 127 while ambulating. She will have close to 24/7 assist/supervision from her daughter and daughter's wife at d/c. Pt will benefit from skilled PT to increase their independence and safety with mobility to allow discharge to the venue listed below.      Follow Up Recommendations Home health PT;Supervision for mobility/OOB    Equipment Recommendations  None recommended by PT    Recommendations for Other Services       Precautions / Restrictions Precautions Precautions: Fall;Other (comment) Precaution Comments: monitor HR Restrictions Weight Bearing Restrictions: No      Mobility  Bed Mobility Overal bed mobility: Modified Independent             General bed mobility comments: Supine>sit no cues or assist needed.  Transfers Overall transfer level: Needs assistance Equipment used: None Transfers: Sit to/from Stand Sit to Stand: Supervision         General transfer comment: Supervision for safety.   Ambulation/Gait Ambulation/Gait assistance: Supervision Ambulation Distance (Feet): 125 Feet Assistive device: None Gait Pattern/deviations: Step-through pattern;Decreased stride length     General Gait Details: Mild instability but no LOB while ambulating without AD.  DOE noted but SpO2 remains at or above 95% on RA.  HR up to 127.  Stairs            Wheelchair Mobility    Modified Rankin (Stroke Patients Only)        Balance Overall balance assessment: Needs assistance Sitting-balance support: No upper extremity supported;Feet supported Sitting balance-Leahy Scale: Good Sitting balance - Comments: Pt able to don socks sitting in chair without assist   Standing balance support: No upper extremity supported;During functional activity Standing balance-Leahy Scale: Fair                   Standardized Balance Assessment Standardized Balance Assessment : Dynamic Gait Index   Dynamic Gait Index Level Surface: Mild Impairment (dec speed) Change in Gait Speed: Normal Gait with Horizontal Head Turns: Mild Impairment Gait with Vertical Head Turns: Mild Impairment Gait and Pivot Turn: Normal Step Over Obstacle: Mild Impairment Step Around Obstacles: Normal       Pertinent Vitals/Pain Pain Assessment: No/denies pain    Home Living Family/patient expects to be discharged to:: Private residence Living Arrangements: Children (living with daughter for next 30 days) Available Help at Discharge: Family;Available PRN/intermittently (only 2-3 hrs during the day that pt will be alone) Type of Home: Apartment (2nd floor) Home Access: Stairs to enter Entrance Stairs-Rails: Right;Left Entrance Stairs-Number of Steps: 10 Home Layout: One level Home Equipment: Grab bars - tub/shower;Hand held shower head;Walker - 2 wheels;Cane - single point      Prior Function Level of Independence: Independent with assistive device(s)         Comments: Denies need for [facility] or RW use and denies any falls in the past 6 months.  Reports she pushes cart when shopping.     Hand Dominance   Dominant Hand: Right    Extremity/Trunk Assessment  Upper Extremity Assessment: Defer to OT evaluation           Lower Extremity Assessment: Overall WFL for tasks assessed         Communication   Communication: No difficulties  Cognition Arousal/Alertness: Awake/alert Behavior During Therapy: WFL for tasks  assessed/performed Overall Cognitive Status: Within Functional Limits for tasks assessed                      General Comments      Exercises        Assessment/Plan    PT Assessment Patient needs continued PT services  PT Diagnosis Difficulty walking   PT Problem List Decreased balance;Decreased safety awareness;Cardiopulmonary status limiting activity  PT Treatment Interventions DME instruction;Gait training;Stair training;Functional mobility training;Therapeutic activities;Therapeutic exercise;Balance training;Patient/family education   PT Goals (Current goals can be found in the Care Plan section) Acute Rehab PT Goals Patient Stated Goal: [place] [facility], maybe consider Assisted Living for long term plan (per daughter) PT Goal Formulation: With patient/family Time For Goal Achievement: 03/10/16 Potential to Achieve Goals: Good    Frequency Min 3X/week   Barriers to discharge Inaccessible home environment 10 steps to enter apartment    Co-evaluation               End of Session Equipment Utilized During Treatment: Gait belt Activity Tolerance: Patient tolerated treatment well Patient left: in chair;with call bell/phone within reach;with family/visitor present Nurse Communication: Mobility status    Functional Assessment Tool Used: Clinical Judgement Functional Limitation: Mobility: Walking and moving around Mobility: Walking and Moving Around Current Status [phone](G8978): At least 1 percent but less than 20 percent impaired, limited or restricted Mobility: Walking and Moving Around Goal Status [phone]([id number]): At least 1 percent but less than 20 percent impaired, limited or restricted    Time: 1515-1530 PT Time Calculation (min) (ACUTE ONLY): 15 min   Charges:   PT Evaluation $PT Eval Low Complexity: 1 Procedure     PT G Codes:   PT G-Codes **NOT FOR INPATIENT CLASS** Functional Assessment Tool Used: Clinical Judgement Functional Limitation: Mobility:  Walking and moving around Mobility: Walking and Moving Around Current Status ([id number](G8978): At least 1 percent but less than 20 percent impaired, limited or restricted Mobility: Walking and Moving Around Goal Status [phone](G8979): At least 1 percent but less than 20 percent impaired, limited or restricted   Encarnacion ChuAshley Abashian PT, DPT  Pager: [phone] Phone: [phone] 02/25/2016, 3:42 PM

## 2016-02-25 NOTE — Progress Notes (Signed)
Family Medicine Teaching Service Daily Progress Note Intern Pager: [phone]  Patient name: Carolyn [name] Papandrea Medical record number: [id number] Date of birth: Jul 28, 1946 Age: 70 y.o. Gender: female  Primary Care Provider: No primary care provider on file. Consultants: Cardiology Code Status: DNR  Pt Overview and Major Events to Date:  Echocardiogram Head CT  Assessment and Plan: Carolyn [name] Casados is a 70 y.o. female presenting with Syncope . PMH is significant for HTN, HLD, T2DM, afib.  Syncope. Concern for cardiac etiology given history of CAD and abrupt onset. Consider vasovagal syncope given preceding feeling of warmness and diaphoresis, though somewhat of an atypical presentation. Doubt orthostasis as patient's symptoms occurred while lying flat - though possible. CBG reportedly 121 at the scene - doubt hypoglycemia as the cause, though patient has had labile CBGS lately per family. Initial troponin and EKG negative. No signs of seizure activity. -- F/u on telemetry  -- F/u echo -- Card consult for holter monitor placement -- PT/OT consult  Elevated Creatinine. Cr 1.9 on admission, this morning 1.97.  Appears euvolemic on exam.  -- Maintenance IVF overnight -- Hold home ACE inhibitor -- Try to obtain PCP records -- Trend BMP  Normocytic Anemia. Hgb 9.6 on admission. Hemoglobin 8.2 this morning. Most likely  dilutional. -- Obtain records -- Trend CBC  Pyuria. UA in ED with small leukocytes, many bacteria, many squamous cells. Received one dose of CTX in ED. Asymptomatic.  - Would not treat as patient does not meed McGeer Criteria.    NPH - Head CT with questionable superimposed normal pressure hydrocephalus. Does not have urinary incontinence or any obvious mental status alterations.  - PT consulted as above  ?history of atrial fibrillation. Patient reports history of atrial fibrillation. Sinus rhythm on admission. Chads2vasc at least 4. INR 1.67, patient is unsure about  anticoagulation. - Obtain outside records re: anticoagulation - If not on anticoagulation at home, should be at least on aspirin.   HLD/CAD/HTN. Stent placed in 2014. Does not appear to be on a statin or ASA. - Obtain records, start statin and ASA 81mg  if not on already - Hold home ACE inhibitor for AKI  T2DM. -- Hold home glipizide -- Restarted gabapentin - SSI  FEN/GI: 1/2 NS @75  cc/hr  PPx: Lovenox   Disposition: Home with clinical improvement  Subjective:  Patient had a good night of sleep, patient denies, shortness of breath, headaches, dizziness, nausea, diarrhea.   Objective: Temp:  [98 F (36.7 C)-99.4 F (37.4 C)] 98.4 F (36.9 C) (07/12 0417) Pulse Rate:  [85-138] 93 (07/12 0417) Resp:  [10-25] 20 (07/12 0417) BP: (90-184)/(49-98) 102/51 mmHg (07/12 0502) SpO2:  [97 %-100 %] 99 % (07/12 0417) Weight:  [180 lb (81.647 kg)] 180 lb (81.647 kg) (07/11 1032) Physical Exam: General: 70 year old female in NAD, sitting in bedside chair Eyes: EOMI ENTM: MMM, O/P clear Neck: Supple, no bruits Cardiovascular: Tachycardic, No murmurs noted Respiratory: NWOB, CTAB Abdomen: S, NT, ND MSK: No cyanosis or edema Skin: Warm, dry Neuro: Alert and conversational. CN2-12 intact. Strength 5/5 in upper and lower extremities. Gait deferred.  Psych: Appropriate mood and affect.    Laboratory:  Recent Labs Lab 02/24/16 1220 02/24/16 1223 02/24/16 1927 02/25/16 0407  WBC 8.3  --  9.6 9.2  HGB 9.6* 9.9* 9.1* 8.2*  HCT 28.9* 29.0* 28.3* 24.7*  PLT 284  --  316 287    Recent Labs Lab 02/24/16 1220 02/24/16 1223 02/24/16 1927 02/25/16 0407  NA 140 141  --  140  K 4.0 5.1  --  4.0  CL 110 109  --  110  CO2 22  --   --  22  BUN 16 20  --  20  CREATININE 1.91* 1.90* 1.93* 1.97*  CALCIUM 8.7*  --   --  8.5*  GLUCOSE 191* 192*  --  62*      Imaging/Diagnostic Tests:  Head CT w/o contrast  Mild generalized atrophy. Question a degree of superimposed  normal pressure hydrocephalus. There is no intracranial mass or hemorrhage. There is slight periventricular small vessel disease. No acute infarct. There are areas of paranasal sinus disease.  Chest X ray   Normal sized heart. Clear lungs. Small calcified granulomas in the right upper lobe. Thoracic spine degenerative changes.  Lovena Neighbours, MD 02/25/2016, 9:09 AM PGY-1, [facility] Family Medicine FPTS Intern pager: [phone], text pages welcome

## 2016-02-25 NOTE — Discharge Instructions (Signed)
You will be contacted by Cardiology to set cardiac event monitor in the outpatient setting. You should follow with your regular doctor within a week, and discuss events that led to hospitalization.

## 2016-02-25 NOTE — Progress Notes (Signed)
Occupational Therapy Evaluation Patient Details Name: Carolyn [name] MRN: [id number] DOB: [birth year] Today's Date: 02/25/2016    History of Present Illness 70 y.o. female admitted for syncope, a-fib, and nausea and vomitting. PMH significant for HTN and diabetes type II.   Clinical Impression   PTA, pt was independent with all ADLs and mobility. Pt currently requires supervision for all ADL tasks and basic transfers due to balance deficits. Pt's SpO2 on RA at rest and during activity remained between 93-95% with cues for breathing strategies. Pt plans to d/c to her daughter's home with intermittent assistance from her daughter. Pt will benefit from continued acute OT to increase independence and safety with ADLs and mobility to allow for safe discharge home. No OT follow up or DME recommended at this time.    Follow Up Recommendations  No OT follow up;Supervision - Intermittent    Equipment Recommendations  None recommended by OT    Recommendations for Other Services       Precautions / Restrictions Precautions Precautions: Fall Restrictions Weight Bearing Restrictions: No      Mobility Bed Mobility               General bed mobility comments: Pt up ambulating to bathroom with NT on OT arrival  Transfers Overall transfer level: Needs assistance Equipment used: Rolling walker (2 wheeled);None Transfers: Sit to/from Stand Sit to Stand: Supervision         General transfer comment: supervision for safety. Pt ambulates safer with RW - without AD pt takes small shuffling steps and reaches out for furniture for support. VCs for safety and to continue to using RW for the time being.    Balance Overall balance assessment: Needs assistance Sitting-balance support: No upper extremity supported;Feet supported Sitting balance-Leahy Scale: Good     Standing balance support: No upper extremity supported;During functional activity Standing balance-Leahy Scale:  Fair Standing balance comment: able to maintain balance without UE support for static standing tasks, but reaches out for support during dynamic balance tasks.                            ADL Overall ADL's : Needs assistance/impaired Eating/Feeding: Independent;Sitting   Grooming: Wash/dry hands;Supervision/safety;Standing   Upper Body Bathing: Supervision/ safety;Standing   Lower Body Bathing: Supervison/ safety;Sit to/from stand   Upper Body Dressing : Modified independent;Sitting   Lower Body Dressing: Supervision/safety;Sit to/from stand   Toilet Transfer: Retail banker;Ambulation;RW   Toileting- Clothing Manipulation and Hygiene: Supervision/safety;Sit to/from stand       Functional mobility during ADLs: Supervision/safety;Rolling walker       Vision Vision Assessment?: No apparent visual deficits   Perception     Praxis      Pertinent Vitals/Pain Pain Assessment: No/denies pain     Hand Dominance Right   Extremity/Trunk Assessment Upper Extremity Assessment Upper Extremity Assessment: Overall WFL for tasks assessed   Lower Extremity Assessment Lower Extremity Assessment: Defer to PT evaluation       Communication Communication Communication: No difficulties   Cognition Arousal/Alertness: Awake/alert Behavior During Therapy: WFL for tasks assessed/performed Overall Cognitive Status: Within Functional Limits for tasks assessed                     General Comments       Exercises       Shoulder Instructions      Home Living Family/patient expects to be discharged to:: Private residence Living Arrangements: Children (living  with daughter for next 30 days) Available Help at Discharge: Family Type of Home: Apartment (2nd floor) Home Access: Stairs to enter Entrance Stairs-Number of Steps: 10 Entrance Stairs-Rails: Right;Left Home Layout: One level     Bathroom Shower/Tub: Tub/shower unit Shower/tub  characteristics: Curtain FirefighterBathroom Toilet: Handicapped height     Home Equipment: Grab bars - tub/shower;Hand held Careers information officershower head;Walker - 2 wheels;Cane - single point          Prior Functioning/Environment Level of Independence: Independent with assistive device(s)        Comments: uses SPC or RW for community mobility    OT Diagnosis: Generalized weakness   OT Problem List: Decreased strength;Decreased activity tolerance;Impaired balance (sitting and/or standing);Decreased safety awareness;Decreased knowledge of use of DME or AE;Obesity   OT Treatment/Interventions: Self-care/ADL training;DME and/or AE instruction;Therapeutic activities;Patient/family education;Balance training;Energy conservation    OT Goals(Current goals can be found in the care plan section) Acute Rehab OT Goals Patient Stated Goal: [facility] OT Goal Formulation: With patient Time For Goal Achievement: 03/10/16 Potential to Achieve Goals: Good ADL Goals Pt Will Perform Tub/Shower Transfer: Tub transfer;ambulating;rolling walker;with modified independence Additional ADL Goal #1: Pt will utilize 2 energy conservation strategies during ADL tasks with min verbal cues.  OT Frequency: Min 2X/week   Barriers to D/C:            Co-evaluation              End of Session Equipment Utilized During Treatment: Gait belt;Rolling walker Nurse Communication: Mobility status  Activity Tolerance: Patient tolerated treatment well Patient left: in chair;with call bell/phone within reach   Time: 0813-0825 OT Time Calculation (min): 12 min Charges:  OT General Charges $OT Visit: 1 Procedure OT Evaluation $OT Eval Low Complexity: 1 Procedure G-Codes: OT G-codes **NOT FOR INPATIENT CLASS** Functional Assessment Tool Used: clinical judgement Functional Limitation: Self care Self Care Current Status (U9811(G8987): At least 1 percent but less than 20 percent impaired, limited or restricted Self Care Goal  Status ([id number](G8988): At least 1 percent but less than 20 percent impaired, limited or restricted Self Care Discharge Status [phone]([phone]): At least 1 percent but less than 20 percent impaired, limited or restricted  Nils PyleJulia Tiara Bartoli, OTR/L Pager: [phone] 02/25/2016, 9:13 AM

## 2016-02-25 NOTE — Progress Notes (Signed)
  Echocardiogram 2D Echocardiogram has been performed.  Carolyn SavoyCasey N [name] [name] 02/25/2016, 12:16 PM

## 2016-02-25 NOTE — Progress Notes (Signed)
Hypoglycemic Event  CBG: 62  Treatment: 15 GM carbohydrate snack  Symptoms: None  Follow-up CBG: Time: CBG Result:  Possible Reasons for Event: Medication regimen: Lantus  Comments/MD notified: n/a hypoglycemia protocol followed    Carlos LeveringMcAtee, Chevonne Bostrom R

## 2016-12-24 IMAGING — CT CT HEAD W/O CM
4 series · 16 of 47 positions shown, 18 images · non-contrast
Comparison: None.

CLINICAL DATA: Unwitnessed fall with period of unresponsiveness

EXAM:
CT HEAD WITHOUT CONTRAST
TECHNIQUE: Contiguous axial images were obtained from the base of the skull
through the vertex without intravenous contrast.

[Series 2: head without · axial · non-contrast · 0.42mm/px · z∈[+1203,+1323]mm · 7 of 32 slices shown, 9 images]
[im 4/32  brain]
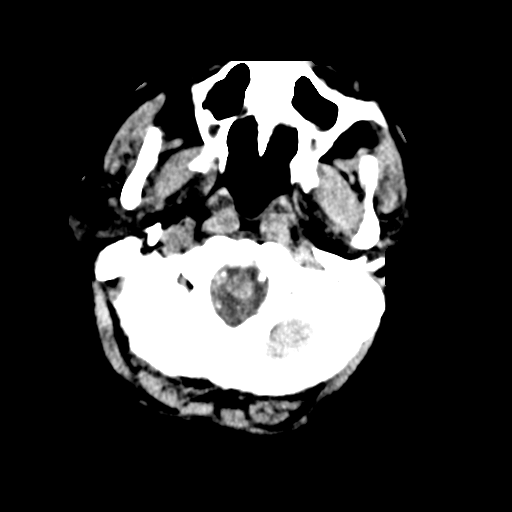
[im 4/32  bone]
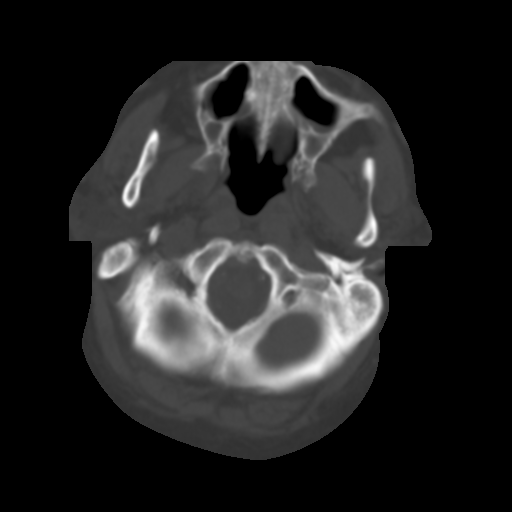
[im 8/32  brain]
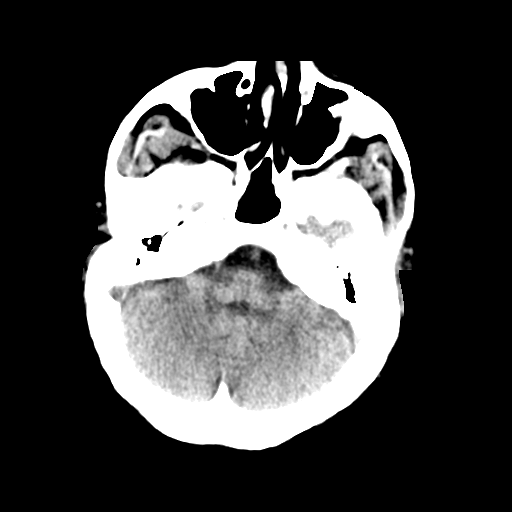
[im 12/32  brain]
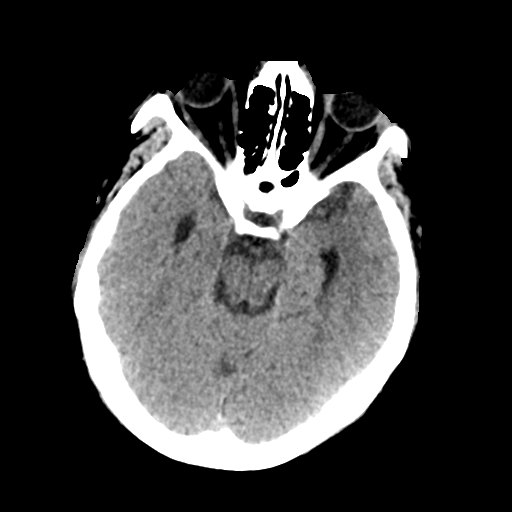
[im 16/32  brain]
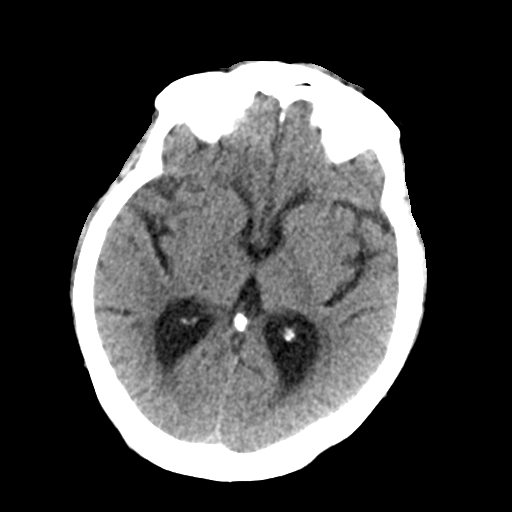
[im 20/32  brain]
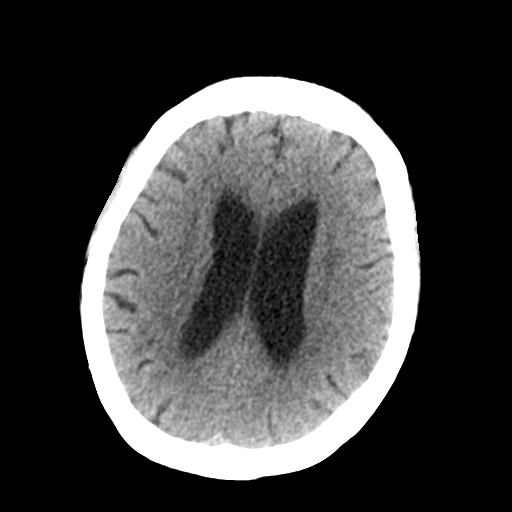
[im 20/32  bone]
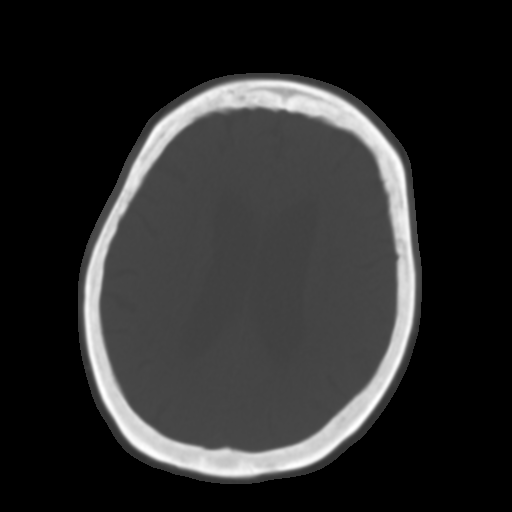
[im 24/32  brain]
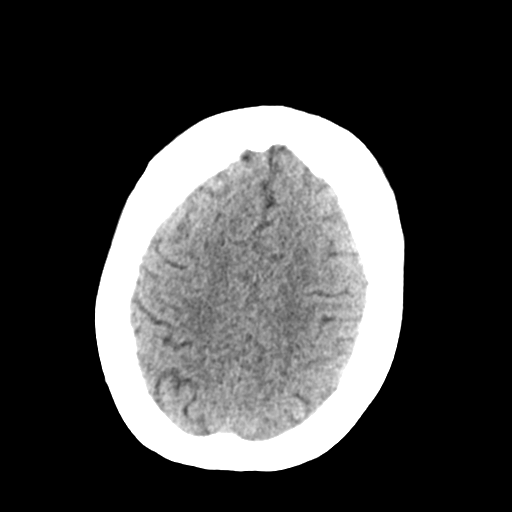
[im 28/32  brain]
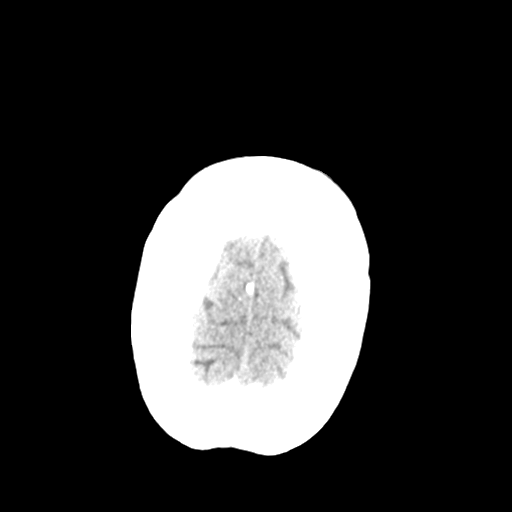

[Series 3: head bone · axial · 0.42mm/px · z∈[+1202,+1234]mm · 3 of 79 slices shown]
[im 8/79  bone]
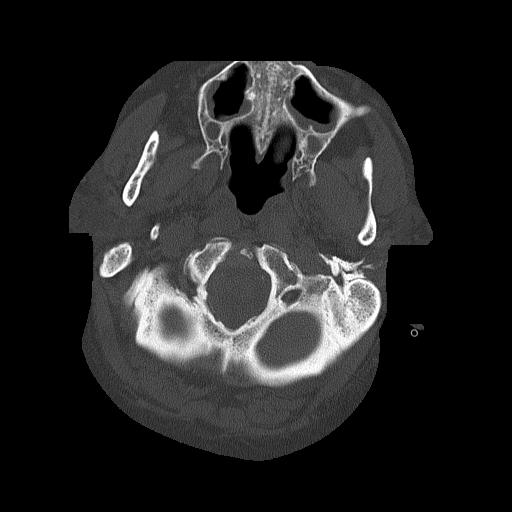
[im 16/79  bone]
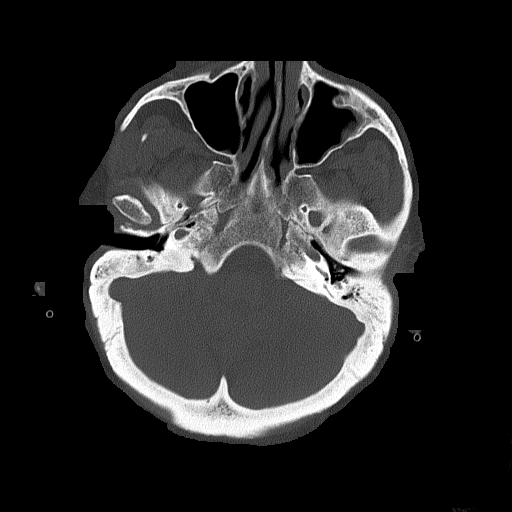
[im 24/79  bone]
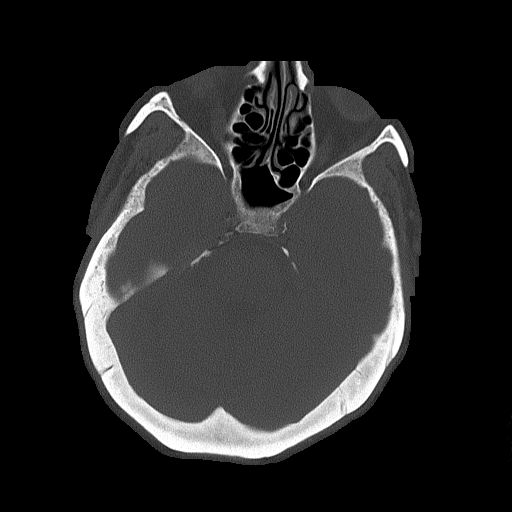

[Series 4: head without cor · coronal · non-contrast · 0.32mm/px · 3 of 61 slices shown]
[im 21/61  brain]
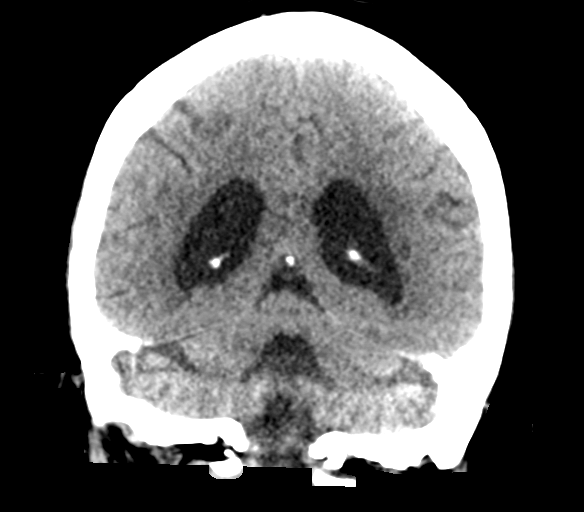
[im 27/61  brain]
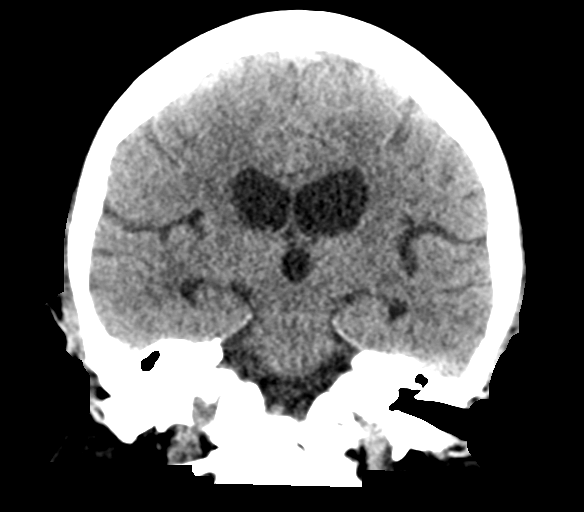
[im 34/61  brain]
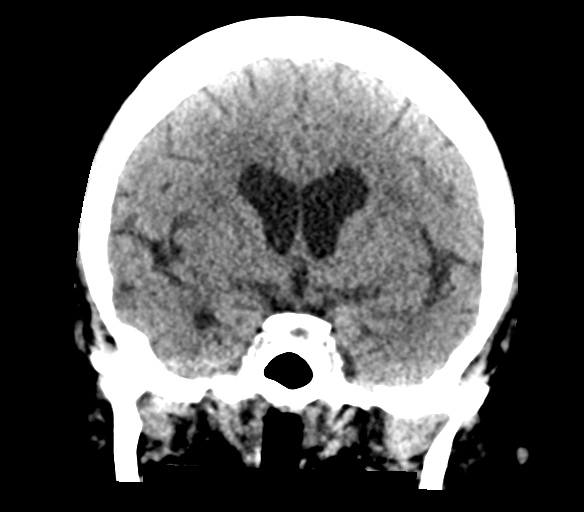

[Series 5: head without sag · sagittal · non-contrast · 0.32mm/px · 3 of 48 slices shown]
[im 16/48  brain]
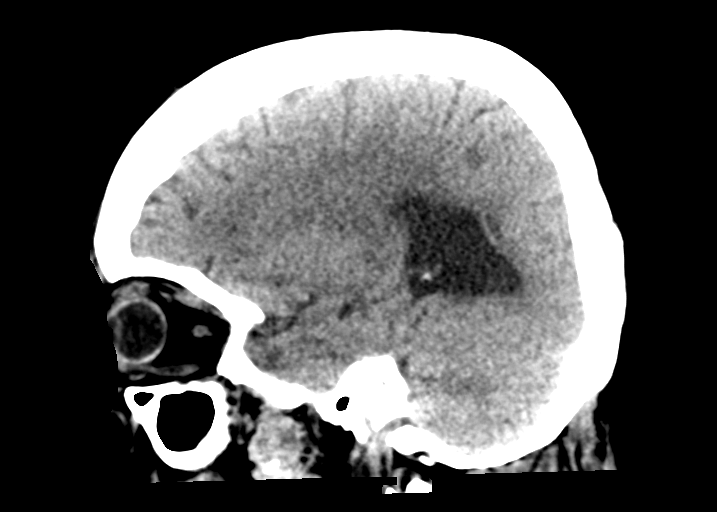
[im 24/48  brain]
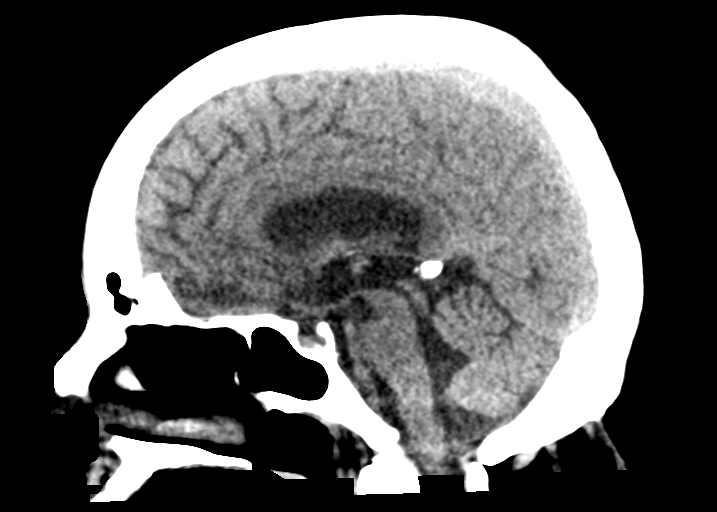
[im 32/48  brain]
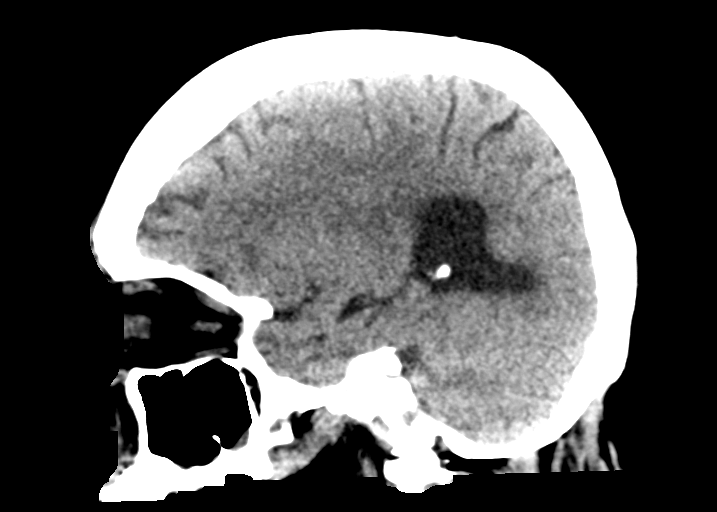

[16 of 47 positions shown; findings below may reference images not displayed]

FINDINGS: Brain: There is mild generalized atrophy. The ventricles in
proportion are more prominent than the sulci. There is no
intracranial mass, hemorrhage, extra-axial collection, or midline
shift. There is slight small vessel disease in the centra semiovale
bilaterally. Gray-white compartments elsewhere are normal. No acute
infarct evident.

Vascular: There is a small amount of calcification in each distal
vertebral artery. There are foci of calcification in each cavernous
carotid artery. No hyperdense vessels evident.

Skull: Bony calvarium appears intact.

Sinuses/Orbits: There is mucosal thickening in both maxillary antra.
There is mild mucosal thickening in multiple ethmoid air cells
bilaterally. Other visualized paranasal sinuses are clear. Orbits
appear symmetric and unremarkable bilaterally.

Other: Mastoid air cells are nearly aplastic bilaterally.
IMPRESSION: Mild generalized atrophy. Question a degree of superimposed normal
pressure hydrocephalus. There is no intracranial mass or hemorrhage.
There is slight periventricular small vessel disease. No acute
infarct. There are areas of paranasal sinus disease.

## 2020-04-16 DEATH — deceased
